# Patient Record
Sex: Female | Born: 1970 | Hispanic: No | Marital: Married | Smoking: Never smoker
Health system: Southern US, Community
[De-identification: ages and names within clinical notes are randomized; demographics above are authoritative.]

## PROBLEM LIST (undated history)

## (undated) DIAGNOSIS — I10 Essential (primary) hypertension: Secondary | ICD-10-CM

---

## 2010-05-27 ENCOUNTER — Encounter: Payer: Self-pay | Admitting: Orthopedic Surgery

## 2018-01-13 ENCOUNTER — Emergency Department (INDEPENDENT_AMBULATORY_CARE_PROVIDER_SITE_OTHER): Payer: Managed Care, Other (non HMO)

## 2018-01-13 ENCOUNTER — Emergency Department (INDEPENDENT_AMBULATORY_CARE_PROVIDER_SITE_OTHER)
Admission: EM | Admit: 2018-01-13 | Discharge: 2018-01-13 | Disposition: A | Discharge status: Home / Self Care | Payer: Managed Care, Other (non HMO) | Source: Home / Self Care | Attending: Family Medicine | Admitting: Family Medicine

## 2018-01-13 ENCOUNTER — Other Ambulatory Visit: Payer: Self-pay

## 2018-01-13 DIAGNOSIS — S39012A Strain of muscle, fascia and tendon of lower back, initial encounter: Secondary | ICD-10-CM

## 2018-01-13 DIAGNOSIS — M542 Cervicalgia: Secondary | ICD-10-CM | POA: Diagnosis not present

## 2018-01-13 DIAGNOSIS — S29012A Strain of muscle and tendon of back wall of thorax, initial encounter: Secondary | ICD-10-CM | POA: Diagnosis not present

## 2018-01-13 DIAGNOSIS — S161XXA Strain of muscle, fascia and tendon at neck level, initial encounter: Secondary | ICD-10-CM

## 2018-01-13 HISTORY — DX: Essential (primary) hypertension: I10

## 2018-01-13 MED ORDER — MELOXICAM 15 MG PO TABS: 15.0000 mg | ORAL_TABLET | Freq: Every day | ORAL | 0 refills | Status: DC

## 2018-01-13 MED ORDER — CYCLOBENZAPRINE HCL 10 MG PO TABS: 10.0000 mg | ORAL_TABLET | Freq: Three times a day (TID) | ORAL | 0 refills | Status: DC
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
## 2018-01-13 NOTE — Discharge Instructions (Addendum)
Apply ice pack for 20 to 30 minutes, 3 to 4 times daily  Continue until pain and swelling decrease.  Begin range of motion and stretching exercises as tolerated. 

## 2020-06-08 ENCOUNTER — Other Ambulatory Visit: Payer: Self-pay

## 2020-06-08 ENCOUNTER — Emergency Department (INDEPENDENT_AMBULATORY_CARE_PROVIDER_SITE_OTHER)
Admission: EM | Admit: 2020-06-08 | Discharge: 2020-06-08 | Disposition: A | Discharge status: Home / Self Care | Payer: Managed Care, Other (non HMO) | Source: Home / Self Care | Attending: Family Medicine | Admitting: Family Medicine

## 2020-06-08 ENCOUNTER — Emergency Department (INDEPENDENT_AMBULATORY_CARE_PROVIDER_SITE_OTHER): Payer: Managed Care, Other (non HMO)

## 2020-06-08 DIAGNOSIS — S20211A Contusion of right front wall of thorax, initial encounter: Secondary | ICD-10-CM

## 2020-06-08 DIAGNOSIS — R0789 Other chest pain: Secondary | ICD-10-CM

## 2020-06-08 MED ORDER — CYCLOBENZAPRINE HCL 10 MG PO TABS: 10.0000 mg | ORAL_TABLET | Freq: Three times a day (TID) | ORAL | 0 refills | Status: AC

## 2020-06-08 MED ORDER — NAPROXEN 500 MG PO TABS: 500.0000 mg | ORAL_TABLET | Freq: Two times a day (BID) | ORAL | 0 refills | Status: AC

## 2020-06-08 NOTE — Discharge Instructions (Addendum)
Apply ice pack for 20 to 30 minutes, 3 to 4 times daily  Continue until pain and swelling decrease.  °
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
